# Patient Record
Sex: Male | Born: 2005
Health system: Southern US, Community
[De-identification: ages and names within clinical notes are randomized; demographics above are authoritative.]

---
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
## 2024-09-13 ENCOUNTER — Ambulatory Visit: Admitting: Internal Medicine
